# Patient Record
Sex: Male | Born: 1980 | Race: White | Hispanic: No | Marital: Married | Smoking: Current every day smoker
Health system: Southern US, Community
[De-identification: ages and names within clinical notes are randomized; demographics above are authoritative.]

## PROBLEM LIST (undated history)

## (undated) DIAGNOSIS — Z8709 Personal history of other diseases of the respiratory system: Secondary | ICD-10-CM

## (undated) DIAGNOSIS — Z87828 Personal history of other (healed) physical injury and trauma: Secondary | ICD-10-CM

## (undated) DIAGNOSIS — Z8659 Personal history of other mental and behavioral disorders: Secondary | ICD-10-CM

## (undated) HISTORY — DX: Personal history of other (healed) physical injury and trauma: Z87.828

## (undated) HISTORY — PX: OTHER SURGICAL HISTORY: SHX169

## (undated) HISTORY — DX: Personal history of other mental and behavioral disorders: Z86.59

## (undated) HISTORY — DX: Personal history of other diseases of the respiratory system: Z87.09

---

## 2004-10-17 HISTORY — PX: OTHER SURGICAL HISTORY: SHX169

## 2013-01-16 ENCOUNTER — Encounter: Payer: Self-pay | Admitting: Family Medicine

## 2013-01-16 ENCOUNTER — Ambulatory Visit (INDEPENDENT_AMBULATORY_CARE_PROVIDER_SITE_OTHER): Payer: 59 | Admitting: Family Medicine

## 2013-01-16 VITALS — BP 119/71 | HR 81 | Ht 70.0 in | Wt 202.0 lb

## 2013-01-16 DIAGNOSIS — Z716 Tobacco abuse counseling: Secondary | ICD-10-CM | POA: Insufficient documentation

## 2013-01-16 DIAGNOSIS — Z8709 Personal history of other diseases of the respiratory system: Secondary | ICD-10-CM | POA: Insufficient documentation

## 2013-01-16 DIAGNOSIS — Z7189 Other specified counseling: Secondary | ICD-10-CM

## 2013-01-16 DIAGNOSIS — Z87828 Personal history of other (healed) physical injury and trauma: Secondary | ICD-10-CM

## 2013-01-16 DIAGNOSIS — Z8659 Personal history of other mental and behavioral disorders: Secondary | ICD-10-CM

## 2013-01-16 DIAGNOSIS — Z13220 Encounter for screening for lipoid disorders: Secondary | ICD-10-CM | POA: Insufficient documentation

## 2013-01-16 DIAGNOSIS — Z87891 Personal history of nicotine dependence: Secondary | ICD-10-CM | POA: Insufficient documentation

## 2013-01-16 DIAGNOSIS — F17200 Nicotine dependence, unspecified, uncomplicated: Secondary | ICD-10-CM

## 2013-01-16 DIAGNOSIS — Z131 Encounter for screening for diabetes mellitus: Secondary | ICD-10-CM

## 2013-01-16 HISTORY — DX: Personal history of other mental and behavioral disorders: Z86.59

## 2013-01-16 HISTORY — DX: Personal history of other diseases of the respiratory system: Z87.09

## 2013-01-16 HISTORY — DX: Personal history of other (healed) physical injury and trauma: Z87.828

## 2013-01-16 LAB — LIPID PANEL
HDL: 30 mg/dL — ABNORMAL LOW (ref >39)
LDL Cholesterol: 80 mg/dL (ref 0–99)
Triglycerides: 337 mg/dL — ABNORMAL HIGH (ref <150)
VLDL: 67 mg/dL — ABNORMAL HIGH (ref 0–40)

## 2013-01-16 LAB — BASIC METABOLIC PANEL WITH GFR
Calcium: 9.1 mg/dL (ref 8.4–10.5)
Creat: 0.8 mg/dL (ref 0.50–1.35)
GFR, Est African American: >89 mL/min
GFR, Est Non African American: >89 mL/min
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
## 2013-01-17 ENCOUNTER — Encounter: Payer: Self-pay | Admitting: Family Medicine

## 2013-01-17 ENCOUNTER — Telehealth: Payer: Self-pay | Admitting: Family Medicine

## 2013-01-17 DIAGNOSIS — R7301 Impaired fasting glucose: Secondary | ICD-10-CM | POA: Insufficient documentation

## 2013-01-17 DIAGNOSIS — E781 Pure hyperglyceridemia: Secondary | ICD-10-CM

## 2013-01-17 MED ORDER — FENOFIBRATE 145 MG PO TABS: 145.0000 mg | ORAL_TABLET | Freq: Every day | ORAL | Status: DC

## 2013-01-17 NOTE — Telephone Encounter (Signed)
Sue Lush, Will you please let Erik Hampton know that his triglycerides were elevated at 337 which is double the upper limit of normal.  This can can contribute to chronic liver and pancreas inflammation and should be addressed given the degree of elevation.  I've sent an Rx to [facility] called fenofibrate which helps lower triglycerides.  I'd encourage him to start this and return for fasting labwork in three months.  Triglycerides can be further lowered with 30-45 minutes of exercise most days of the week and lowering saturated fat intake.  His blood sugar was just barely elevated, but not near the diabetic range, this can be controlled signifignatly with exercise as well.

## 2013-01-17 NOTE — Telephone Encounter (Signed)
LMOM that rx had been sent to pharmacy.

## 2013-01-17 NOTE — Telephone Encounter (Signed)
Good catch, thanks.  Rx has now been sent.

## 2013-01-17 NOTE — Telephone Encounter (Signed)
Called and notified pt. Pt voiced understanding; Dr. Ivan Anchors I didn't see where this rx has been sent yet

## 2013-02-17 ENCOUNTER — Encounter: Payer: Self-pay | Admitting: *Deleted

## 2013-02-17 ENCOUNTER — Emergency Department (INDEPENDENT_AMBULATORY_CARE_PROVIDER_SITE_OTHER)
Admission: EM | Admit: 2013-02-17 | Discharge: 2013-02-17 | Disposition: A | Discharge status: Home / Self Care | Payer: 59 | Source: Home / Self Care | Attending: Family Medicine | Admitting: Family Medicine

## 2013-02-17 DIAGNOSIS — T7840XA Allergy, unspecified, initial encounter: Secondary | ICD-10-CM

## 2013-02-17 MED ORDER — METHYLPREDNISOLONE SODIUM SUCC 125 MG IJ SOLR
125.0000 mg | Freq: Once | INTRAMUSCULAR | Status: AC
  Administered 2013-02-17: 125 mg via INTRAMUSCULAR

## 2013-02-17 MED ORDER — HYDROXYZINE HCL 25 MG PO TABS: 25.0000 mg | ORAL_TABLET | Freq: Three times a day (TID) | ORAL | Status: DC | PRN

## 2013-02-17 MED ORDER — PREDNISONE 50 MG PO TABS: ORAL_TABLET | ORAL | Status: DC

## 2013-02-17 NOTE — ED Provider Notes (Signed)
History     CSN: [id number]  Arrival date & time 02/17/13  1117   First MD Initiated Contact with Patient 02/17/13 1120      Chief Complaint  Patient presents with  . Urticaria  . Pruritis   HPI  HPI  This patient complains of a RASH  Location: upper extremities, neck   Onset: 4-5 days   Course: progressive itching, erythema over affected area   Self-treated with: nothing  Improvement with treatment: n/a   History  Itching: yes  Tenderness: no  New medications/antibiotics: yes, started on fenofibrate within last 3-4 weeks   Pet exposure: no  Recent travel or tropical exposure: no  New soaps, shampoos, detergent, clothing: no  Tick/insect exposure: no  Chemical Exposure: no  Red Flags  Feeling ill: no  Fever: no  Facial/tongue swelling/difficulty breathing: no  Diabetic or immunocompromised: no    Past Medical History  Diagnosis Date  . History of spleen injury 01/16/2013  . History of pneumothorax 01/16/2013  . History of anxiety 01/16/2013    Past Surgical History  Procedure Laterality Date  . Ruptured spleen  over 20 years ago  . Hemothorax  2006    Family History  Problem Relation Age of Onset  . Heart disease Paternal Grandfather     History  Substance Use Topics  . Smoking status: Former Smoker    Quit date: 12/16/2012  . Smokeless tobacco: Not on file  . Alcohol Use: Yes      Review of Systems  All other systems reviewed and are negative.    Allergies  Review of patient's allergies indicates no known allergies.  Home Medications   Current Outpatient Rx  Name  Route  Sig  Dispense  Refill  . fenofibrate (TRICOR) 145 MG tablet   Oral   Take 1 tablet (145 mg total) by mouth daily.   30 tablet   4     BP 122/73  Pulse 73  Temp(Src) 97.8 F (36.6 C) (Oral)  Resp 16  Ht 5\' 10"  (1.778 m)  Wt 207 lb 8 oz (94.121 kg)  BMI 29.77 kg/m2  SpO2 96%  Physical Exam  Constitutional: He appears well-developed and well-nourished.  HENT:    Head: Normocephalic and atraumatic.  Eyes: Conjunctivae are normal. Pupils are equal, round, and reactive to light.  Neck: Normal range of motion.  Cardiovascular: Normal rate and regular rhythm.   Pulmonary/Chest: Effort normal.  Abdominal: Soft.  Musculoskeletal: Normal range of motion.  Neurological: He is alert.  Skin: Rash noted.    ED Course  Procedures (including critical care time)  Labs Reviewed - No data to display No results found.   1. Allergic reaction, initial encounter       MDM  Suspect rash likely secondary to fenofibrate.  Will stop medication.  Place on course of prednisone and atarax.  Plan for follow up with PCP in 1-2 weeks for general reassessment.  Discussed general and derm red flags.  Follow up as needed.      The patient and/or caregiver has been counseled thoroughly with regard to treatment plan and/or medications prescribed including dosage, schedule, interactions, rationale for use, and possible side effects and they verbalize understanding. Diagnoses and expected course of recovery discussed and will return if not improved as expected or if the condition worsens. Patient and/or caregiver verbalized understanding.             Doree Albee, MD 02/17/13 [phone]

## 2013-02-17 NOTE — ED Notes (Addendum)
Pt c/o hives, itching x 3 days on arms. States the only thing different is Fenofibrate that was stated 1 month ago. Has tried OTC Hydrocortisone with no help.

## 2013-08-22 ENCOUNTER — Other Ambulatory Visit: Payer: Self-pay

## 2017-12-26 ENCOUNTER — Emergency Department
Admission: EM | Admit: 2017-12-26 | Discharge: 2017-12-26 | Disposition: A | Discharge status: Home / Self Care | Payer: BLUE CROSS/BLUE SHIELD | Source: Home / Self Care | Attending: Emergency Medicine | Admitting: Emergency Medicine

## 2017-12-26 ENCOUNTER — Other Ambulatory Visit: Payer: Self-pay

## 2017-12-26 ENCOUNTER — Encounter: Payer: Self-pay | Admitting: *Deleted

## 2017-12-26 DIAGNOSIS — B309 Viral conjunctivitis, unspecified: Secondary | ICD-10-CM

## 2017-12-26 MED ORDER — OFLOXACIN 0.3 % OP SOLN: 1.0000 [drp] | Freq: Four times a day (QID) | OPHTHALMIC | 0 refills | Status: DC

## 2017-12-26 NOTE — Discharge Instructions (Signed)
Use antibiotic drops as instructed. Follow-up with the eye doctor onThursday if persistent symptoms. Also use drops in your left eye if symptoms develop

## 2017-12-26 NOTE — ED Provider Notes (Signed)
[facility]    CSN: [id number] Arrival date & time: 12/26/17  1603     History   Chief Complaint Chief Complaint  Patient presents with  . Eye Problem    HPI Erik Hampton is a 37 y.o. male.  Patient presents with a one-day histo of pain and discomfort in his right. His eye feels irritated with significant watering. He denies any recent upper resp. infections . He denies any type of work where he could have gotten something in his  eye. He does not wear contacts. He has not had any pain.vision is not affected  Eye Problem    Past Medical History:  Diagnosis Date  . History of anxiety 01/16/2013  . History of pneumothorax 01/16/2013  . History of spleen injury 01/16/2013    Patient Active Problem List   Diagnosis Date Noted  . Hypertriglyceridemia 01/17/2013  . Impaired fasting blood sugar 01/17/2013  . Need for lipid screening 01/16/2013  . History of tobacco abuse 01/16/2013  . History of spleen injury 01/16/2013  . History of pneumothorax 01/16/2013  . Tobacco abuse counseling 01/16/2013  . History of anxiety 01/16/2013    Past Surgical History:  Procedure Laterality Date  . hemothorax  2006  . ruptured spleen  over 20 years ago       Home Medications    Prior to Admission medications   Not on File    Family History Family History  Problem Relation Age of Onset  . Heart disease Paternal Grandfather     Social History Social History   Tobacco Use  . Smoking status: Former Smoker    Packs/day: 0.75    Types: Cigarettes  . Smokeless tobacco: Never Used  Substance Use Topics  . Alcohol use: No    Frequency: Never  . Drug use: No     Allergies   Tricor [fenofibrate]   Review of Systems Review of Systems  Constitutional: Negative.   HENT: Negative for congestion, facial swelling, sinus pain and sore throat.   Eyes:       There is redness and irritation of the right. He has had significant watering. He does have some itching. He  denies pain or sensation of a foreign body in his eye     Physical Exam Triage Vital Signs ED Triage Vitals [12/26/17 1647]  Enc Vitals Group     BP 126/79     Pulse Rate 67     Resp 16     Temp 98.3 F (36.8 C)     Temp Source Oral     SpO2 96 %     Weight 196 lb (88.9 kg)     Height 5\' 10"  (1.778 m)     Head Circumference      Peak Flow      Pain Score 0     Pain Loc      Pain Edu?      Excl. in GC?    No data found.  Updated Vital Signs BP 126/79 (BP Location: Right Arm)   Pulse 67   Temp 98.3 F (36.8 C) (Oral)   Resp 16   Ht 5\' 10"  (1.778 m)   Wt 196 lb (88.9 kg)   SpO2 96%   BMI 28.12 kg/m   Visual Acuity Right Eye Distance: 20/20 Left Eye Distance: 20/20 Bilateral Distance: 20/20(w/o correction)  Right Eye Near:   Left Eye Near:    Bilateral Near:     Physical Exam  Constitutional: He  appears well-developed and well-nourished.  HENT:  Head: Normocephalic.  Eyes: EOM are normal. Pupils are equal, round, and reactive to light.  The conjunctiva is red. There is no purulence noted. Here is no swelling of the lids. Tetracaine was instilled. Floor seen staining revealed no uptake over the cornea. Lid was everted and swabbed with no foreign body found.     UC Treatments / Results  Labs (all labs ordered are listed, but only abnormal results are displayed) Labs Reviewed - No data to display  EKG  EKG Interpretation None       Radiology No results found.  Procedures Procedures (including critical care time)  Medications Ordered in UC Medications - No data to display   Initial Impression / Assessment and Plan / UC Course  I have reviewed the triage vital signs and the nursing notes.  Pertinent labs & imaging results that were available during my care of the patient were reviewed by me and considered in my medical decision making (see chart for details). I suspect this is a viral conjunctivitis.Will treat and cover with Ocuflox.Follow-up  wit an ophthalmologist if improvement over the next 48 hours.      Final Clinical Impressions(s) / UC Diagnoses   Final diagnoses:  Acute viral conjunctivitis of right eye    ED Discharge Orders    None     Use antibiotic drops as instructed. Follow-up with the eye doctor onThursday if persistent symptoms. Also use drops in your left eye if symptoms develop  Controlled Substance Prescriptions [place] Controlled Substance Registry consulted? Not Applicable   Collene Gobbleaub, Steven A, MD 12/26/17 2038

## 2017-12-26 NOTE — ED Triage Notes (Signed)
Pt c/o RT eye irritation, redness and watering x 1 day. Denies recent URI or injury.

## 2021-04-10 ENCOUNTER — Other Ambulatory Visit: Payer: Self-pay

## 2021-04-10 ENCOUNTER — Encounter: Payer: Self-pay | Admitting: Emergency Medicine

## 2021-04-10 ENCOUNTER — Emergency Department
Admission: EM | Admit: 2021-04-10 | Discharge: 2021-04-10 | Disposition: A | Discharge status: Home / Self Care | Payer: BLUE CROSS/BLUE SHIELD | Source: Home / Self Care

## 2021-04-10 DIAGNOSIS — H109 Unspecified conjunctivitis: Secondary | ICD-10-CM | POA: Diagnosis not present

## 2021-04-10 MED ORDER — MOXIFLOXACIN HCL 0.5 % OP SOLN: 1.0000 [drp] | Freq: Three times a day (TID) | OPHTHALMIC | 0 refills | Status: AC

## 2021-04-10 NOTE — Discharge Instructions (Addendum)
Advised patient to take medication as directed.

## 2021-04-10 NOTE — ED Triage Notes (Signed)
R eye pain x 1 week  Pt thought it was irritated from being outside  Pt was burning yard waste /limbs OTC eye gtts- can not recall name - -no relief Wakes up in the am w/ only right eye crusted shut  No COVID vaccine

## 2021-04-10 NOTE — ED Provider Notes (Signed)
[facility]    CSN: [id number] Arrival date & time: 04/10/21  0909      History   Chief Complaint Chief Complaint  Patient presents with   Conjunctivitis    Right    HPI Erik Hampton is a 40 y.o. male.   HPI 40 year old male presents with a right eye pain for 1 week.  Patient reports burning yard waste and limbs in his yard this past week and has been using OTC eyedrops (cannot recall name with no relief).  Reports awakening this morning with right eye crusted shut.  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 Authorizing Provider  moxifloxacin (VIGAMOX) 0.5 % ophthalmic solution Place 1 drop into the right eye 3 (three) times daily for 5 days. 04/10/21 04/15/21 Yes Trevor Iha, FNP    Family History Family History  Problem Relation Age of Onset   Healthy Mother    Healthy Father    Heart disease Paternal Grandfather     Social History Social History   Tobacco Use   Smoking status: Every Day    Packs/day: 0.75    Pack years: 0.00    Types: Cigarettes   Smokeless tobacco: Never  Vaping Use   Vaping Use: Never used  Substance Use Topics   Alcohol use: No   Drug use: No     Allergies   Tricor [fenofibrate]   Review of Systems Review of Systems  Constitutional: Negative.    HENT: Negative.    Eyes:  Positive for pain, discharge and redness.  Respiratory: Negative.    Cardiovascular: Negative.   Gastrointestinal: Negative.   Genitourinary: Negative.   Musculoskeletal: Negative.   Skin: Negative.   Neurological: Negative.     Physical Exam Triage Vital Signs ED Triage Vitals  Enc Vitals Group     BP 04/10/21 0935 123/78     Pulse Rate 04/10/21 0935 71     Resp 04/10/21 0935 15     Temp 04/10/21 0935 98.5 F (36.9 C)     Temp Source 04/10/21 0935 Oral     SpO2 04/10/21 0935 97 %     Weight --      Height --      Head Circumference --      Peak Flow --      Pain Score 04/10/21 0938 0     Pain Loc --      Pain Edu? --      Excl. in GC? --    No data found.  Updated Vital Signs BP 123/78 (BP Location: Right Arm)   Pulse 71   Temp 98.5 F (36.9 C) (Oral)   Resp 15   SpO2 97%   Visual Acuity Right Eye Distance: 20/25 Left Eye Distance: 20/25  Bilateral Distance: 20/20     Physical Exam Vitals and nursing note reviewed.  Constitutional:      General: He is not in acute distress.    Appearance: Normal appearance. He is normal weight. He is not ill-appearing.  HENT:     Head: Normocephalic and atraumatic.     Right Ear: Tympanic membrane and ear canal normal.     Left Ear: Tympanic membrane and ear canal normal.     Mouth/Throat:     Mouth: Mucous membranes are moist.     Pharynx: Oropharynx is clear.  Eyes:     Extraocular Movements: Extraocular movements intact.     Pupils: Pupils are equal, round, and reactive to light.     Comments: Right eye: Conjunctiva +3 injection, trace crusting of lower eyelid at right inner canthus  Cardiovascular:     Rate and Rhythm: Normal rate and regular rhythm.     Pulses: Normal pulses.     Heart sounds: Normal heart sounds.  Pulmonary:     Effort: Pulmonary effort is normal. No respiratory distress.     Breath sounds: Normal breath sounds. No wheezing, rhonchi or rales.  Musculoskeletal:         General: Normal range of motion.     Cervical back: Normal range of motion and neck supple. No tenderness.  Lymphadenopathy:     Cervical: No cervical adenopathy.  Skin:    General: Skin is warm and dry.  Neurological:     General: No focal deficit present.     Mental Status: He is alert and oriented to person, place, and time.  Psychiatric:        Mood and Affect: Mood normal.        Behavior: Behavior normal.     UC Treatments / Results  Labs (all labs ordered are listed, but only abnormal results are displayed) Labs Reviewed - No data to display  EKG   Radiology No results found.  Procedures Procedures (including critical care time)  Medications Ordered in UC Medications - No data to display  Initial Impression / Assessment and Plan / UC Course  I have reviewed the triage vital signs and the nursing notes.  Pertinent labs & imaging results that were available during my care of the patient were reviewed by me and considered in my medical decision making (see chart for details).    MDM: 1.  Conjunctivitis of right eye, unspecified conjunctivitis type.  Patient discharged home, hemodynamically stable. Final Clinical Impressions(s) / UC Diagnoses   Final diagnoses:  Conjunctivitis of right eye, unspecified conjunctivitis type     Discharge Instructions      Advised patient to take medication as directed.     ED Prescriptions     Medication Sig Dispense Auth. Provider   moxifloxacin (VIGAMOX) 0.5 % ophthalmic solution Place 1 drop into the right eye 3 (three) times daily for 5 days. 3 mL Trevor Iha, FNP      PDMP not reviewed this encounter.   Trevor Iha, FNP 04/10/21 1029

## 2022-03-09 ENCOUNTER — Emergency Department
Admission: EM | Admit: 2022-03-09 | Discharge: 2022-03-09 | Disposition: A | Discharge status: Home / Self Care | Payer: BC Managed Care – PPO | Source: Home / Self Care

## 2022-03-09 ENCOUNTER — Emergency Department (INDEPENDENT_AMBULATORY_CARE_PROVIDER_SITE_OTHER): Payer: BC Managed Care – PPO

## 2022-03-09 DIAGNOSIS — S62603B Fracture of unspecified phalanx of left middle finger, initial encounter for open fracture: Secondary | ICD-10-CM

## 2022-03-09 DIAGNOSIS — S6992XA Unspecified injury of left wrist, hand and finger(s), initial encounter: Secondary | ICD-10-CM

## 2022-03-09 DIAGNOSIS — S62607A Fracture of unspecified phalanx of left little finger, initial encounter for closed fracture: Secondary | ICD-10-CM

## 2022-03-09 NOTE — ED Provider Notes (Signed)
[facility]    CSN: [id number] Arrival date & time: 03/09/22  1422      History   Chief Complaint Chief Complaint  Patient presents with   Finger Injury    LT pinky    HPI Erik Hampton is a 41 y.o. male.   HPI 41 year old male presents with left pinky injury 3 weeks ago.  Patient reports was playing basketball with his son when he jammed his left fifth finger.  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   History of pneumothorax 01/16/2013   Tobacco abuse counseling 01/16/2013   History of anxiety 01/16/2013    Past Surgical History:  Procedure Laterality Date   hemothorax  2006   ruptured spleen  over 20 years ago       Home Medications    Prior to Admission medications   Not on File    Family History Family History  Problem Relation Age of Onset   Healthy Mother    Healthy Father    Heart disease Paternal Grandfather     Social History Social History   Tobacco Use   Smoking status: Every Day    Packs/day: 0.75    Types: Cigarettes   Smokeless tobacco: Never  Vaping Use   Vaping Use: Never used  Substance Use Topics   Alcohol use: No   Drug use: No     Allergies   Tricor [fenofibrate]   Review of Systems Review of Systems  Musculoskeletal:        Left fifth finger pain x 3-weeks    Physical Exam Triage Vital Signs ED Triage Vitals [03/09/22 1435]  Enc Vitals Group     BP 138/78     Pulse Rate 81     Resp 18     Temp 98 F (36.7 C)     Temp Source Oral     SpO2 96 %     Weight      Height      Head Circumference      Peak Flow      Pain Score 2     Pain Loc      Pain  Edu?      Excl. in [place]?    No data found.  Updated Vital Signs BP 138/78 (BP Location: Right Arm)   Pulse 81   Temp 98 F (36.7 C) (Oral)   Resp 18   SpO2 96%      Physical Exam Vitals and nursing note reviewed.  Constitutional:      Appearance: Normal appearance. He is normal weight.  HENT:     Head: Normocephalic and atraumatic.     Mouth/Throat:     Mouth: Mucous membranes are moist.     Pharynx: Oropharynx is clear.  Eyes:     Extraocular Movements: Extraocular movements intact.     Conjunctiva/sclera: Conjunctivae normal.     Pupils: Pupils are equal, round, and reactive to light.  Cardiovascular:     Rate and Rhythm: Normal rate and regular rhythm.     Pulses: Normal pulses.     Heart sounds: Normal heart sounds.  Pulmonary:  Effort: Pulmonary effort is normal.     Breath sounds: Normal breath sounds. No wheezing, rhonchi or rales.  Musculoskeletal:     Cervical back: Normal range of motion and neck supple.     Comments: Left hand fifth finger (dorsum): TTP over proximal/medial phalanx with mild soft tissue swelling noted at IP junction, LROM  Skin:    General: Skin is warm and dry.  Neurological:     General: No focal deficit present.     Mental Status: He is alert and oriented to person, place, and time.     UC Treatments / Results  Labs (all labs ordered are listed, but only abnormal results are displayed) Labs Reviewed - No data to display  EKG   Radiology DG Finger Little Left  Result Date: 03/09/2022 CLINICAL DATA:  Finger injury EXAM: LEFT LITTLE FINGER 2+V COMPARISON:  None Available. FINDINGS: There appears to be a subtle acute nondisplaced fracture at the volar base of the fifth middle phalanx. No dislocation. Soft tissue swelling about the proximal interphalangeal joint. IMPRESSION: Suspected fracture at the volar base of the fifth middle phalanx as described, correlate with point tenderness. Electronically Signed   By: Ofilia Neas  M.D.   On: 03/09/2022 14:49    Procedures Procedures (including critical care time)  Medications Ordered in UC Medications - No data to display  Initial Impression / Assessment and Plan / UC Course  I have reviewed the triage vital signs and the nursing notes.  Pertinent labs & imaging results that were available during my care of the patient were reviewed by me and considered in my medical decision making (see chart for details).     MDM: 1.  Finger injury, left, initial encounter-Patient reports jammed while playing basketball with his son 3 weeks ago, left hand x-ray revealed above; 2. Close non-displaced fracture of middle phalanx of left fifth finger-Advised/informed patient of left fifth finger x-ray results with hard copy provided to patient.  Instructed patient to wear left fifth finger splint 24/7 until being evaluated by orthopedic provider for further evaluation and fracture management.  [facility] orthopedic provider contact information included with this AVS. patient discharged home, hemodynamically stable. Final Clinical Impressions(s) / UC Diagnoses   Final diagnoses:  Finger injury, left, initial encounter  Closed displaced fracture of phalanx of left little finger, unspecified phalanx, initial encounter     Discharge Instructions      Advised/informed patient of left fifth finger x-ray results with hard copy provided to patient.  Instructed patient to wear left fifth finger splint 24/7 until being evaluated by orthopedic provider for further evaluation and fracture management.  [facility] orthopedic provider contact information included with this AVS.     ED Prescriptions   None    PDMP not reviewed this encounter.   Eliezer Lofts, [place] 03/09/22 1537

## 2022-03-09 NOTE — ED Triage Notes (Signed)
Pt c/o LT pinky injury x 3 weeks. Was playing basketball with son when he jammed his finger. Pain and swelling at time, swelling resolved but still having Ltd ROM. Pain 2/10

## 2022-03-09 NOTE — Discharge Instructions (Addendum)
Advised/informed patient of left fifth finger x-ray results with hard copy provided to patient.  Instructed patient to wear left fifth finger splint 24/7 until being evaluated by orthopedic provider for further evaluation and fracture management.  [facility] orthopedic provider contact information included with this AVS.

## 2022-10-06 ENCOUNTER — Ambulatory Visit
Admission: EM | Admit: 2022-10-06 | Discharge: 2022-10-06 | Disposition: A | Discharge status: Home / Self Care | Payer: BC Managed Care – PPO | Attending: Family Medicine | Admitting: Family Medicine

## 2022-10-06 DIAGNOSIS — H6692 Otitis media, unspecified, left ear: Secondary | ICD-10-CM

## 2022-10-06 MED ORDER — AMOXICILLIN-POT CLAVULANATE 875-125 MG PO TABS: ORAL_TABLET | ORAL | 0 refills | Status: DC

## 2022-10-06 NOTE — ED Triage Notes (Signed)
Pt presents with c/o rt ear pain x 3 days

## 2022-10-06 NOTE — Discharge Instructions (Addendum)
May take Pseudoephedrine (30mg, one or two every 4 to 6 hours) for sinus congestion.    May use Afrin nasal spray (or generic oxymetazoline) each morning for about 5 days and then discontinue.  Also recommend using saline nasal spray several times daily and saline nasal irrigation (AYR is a common brand).  Use Flonase nasal spray each morning after using Afrin nasal spray and saline nasal irrigation.   

## 2022-10-06 NOTE — ED Provider Notes (Signed)
[facility]    CSN: [id number] Arrival date & time: 10/06/22  1247      History   Chief Complaint Chief Complaint  Patient presents with   Otalgia    HPI Erik Hampton is a 41 y.o. male.   Five days ago patient developed myalgias, fever (now resolved), and sinus congestion.  Yesterday he developed left ear pain with purulent drainage.  He denies cough and shortness of breath.  The history is provided by the patient.    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 Authorizing Provider  amoxicillin-clavulanate (AUGMENTIN) 875-125 MG tablet Take one tab PO Q12hr PC 10/06/22  Yes Eldine Rencher, Tera Mater, MD    Family History Family History  Problem Relation Age of Onset   Healthy Mother    Healthy Father    Heart disease Paternal Grandfather     Social History Social History   Tobacco Use   Smoking status: Every Day    Packs/day: 0.75    Types: Cigarettes   Smokeless tobacco: Never  Vaping Use   Vaping Use: Never used  Substance Use Topics   Alcohol use: No   Drug use: No     Allergies   Tricor [fenofibrate]   Review of Systems Review of Systems No sore throat No cough No pleuritic pain No wheezing + nasal congestion + post-nasal drainage + sinus pain/pressure No itchy/red  eyes + left earache No hemoptysis No SOB No fever/chills No nausea No vomiting No abdominal pain No diarrhea No urinary symptoms No skin rash No fatigue + myalgias No headache   Physical Exam Triage Vital Signs ED Triage Vitals  Enc Vitals Group     BP 10/06/22 1342 139/84     Pulse Rate 10/06/22 1342 80     Resp 10/06/22 1342 12     Temp 10/06/22 1342 98.8 F (37.1 C)     Temp Source 10/06/22 1342 Oral     SpO2 10/06/22 1342 98 %     Weight --      Height --      Head Circumference --      Peak Flow --      Pain Score 10/06/22 1343 0     Pain Loc --      Pain Edu? --      Excl. in [place]? --    No data found.  Updated Vital Signs BP 139/84 (BP Location: Right Arm)   Pulse 80   Temp 98.8 F (37.1 C) (Oral)   Resp 12   SpO2 98%   Visual Acuity Right Eye Distance:   Left Eye Distance:   Bilateral Distance:    Right Eye Near:   Left Eye Near:    Bilateral Near:  Physical Exam Nursing notes and Vital Signs reviewed. Appearance:  Patient appears stated age, and in no acute distress Eyes:  Pupils are equal, round, and reactive to light and accomodation.  Extraocular movement is intact.  Conjunctivae are not inflamed  Ears:  Canals normal. Right tympanic membrane normal.  Left tympanic membrane erythematous with decreased landmarks, and evidence of recent purulent drainage. Nose:  Congested turbinates.  Maxillary sinus tenderness is present.  Pharynx:  Normal Neck:  Supple. No adenopathy. Lungs:  Clear to auscultation.  Breath sounds are equal.  Moving air well. Heart:  Regular rate and rhythm without murmurs, rubs, or gallops.  Abdomen:  Nontender without masses or hepatosplenomegaly.  Bowel sounds are present.  No CVA or flank tenderness.  Extremities:  No edema.  Skin:  No rash present.   UC Treatments / Results  Labs (all labs ordered are listed, but only abnormal results are displayed) Labs Reviewed - No data to display  EKG   Radiology No  results found.  Procedures Procedures (including critical care time)  Medications Ordered in UC Medications - No data to display  Initial Impression / Assessment and Plan / UC Course  I have reviewed the triage vital signs and the nursing notes.  Pertinent labs & imaging results that were available during my care of the patient were reviewed by me and considered in my medical decision making (see chart for details).    Begin Augmentin. Followup with Family Doctor if not improved in one week.  Final Clinical Impressions(s) / UC Diagnoses   Final diagnoses:  Acute left otitis media     Discharge Instructions      May take Pseudoephedrine (30mg , one or two every 4 to 6 hours) for sinus congestion.    May use Afrin nasal spray (or generic oxymetazoline) each morning for about 5 days and then discontinue.  Also recommend using saline nasal spray several times daily and saline nasal irrigation (AYR is a common brand).  Use Flonase nasal spray each morning after using Afrin nasal spray and saline nasal irrigation.      ED Prescriptions     Medication Sig Dispense Auth. Provider   amoxicillin-clavulanate (AUGMENTIN) 875-125 MG tablet Take one tab PO Q12hr PC 14 tablet , MD         Lattie Haw, MD 10/08/22 [phone]

## 2022-10-13 ENCOUNTER — Ambulatory Visit
Admission: RE | Admit: 2022-10-13 | Discharge: 2022-10-13 | Disposition: A | Discharge status: Home / Self Care | Payer: BC Managed Care – PPO | Source: Ambulatory Visit | Attending: Family Medicine | Admitting: Family Medicine

## 2022-10-13 VITALS — BP 127/90 | HR 76 | Temp 98.2°F | Resp 14

## 2022-10-13 DIAGNOSIS — H66003 Acute suppurative otitis media without spontaneous rupture of ear drum, bilateral: Secondary | ICD-10-CM

## 2022-10-13 MED ORDER — CEFDINIR 300 MG PO CAPS: 300.0000 mg | ORAL_CAPSULE | Freq: Two times a day (BID) | ORAL | 0 refills | Status: DC

## 2022-10-13 NOTE — ED Triage Notes (Signed)
Pt presents with continued ear pain, pt states he has one dose of abx left

## 2022-10-13 NOTE — Discharge Instructions (Signed)
Drink lots of water Take the cefdinir 2 times a day for 10 full days Use the Flonase 2 times a day for 1 to 2 days, then once a day until your symptoms have resolved May take over-the-counter medicines as needed for pain

## 2023-11-25 IMAGING — DX DG FINGER LITTLE 2+V*L*
3 series · 3 of 3 positions shown · non-contrast
Comparison: None Available.

CLINICAL DATA: Finger injury

EXAM:
LEFT LITTLE FINGER 2+V

[finger ap]
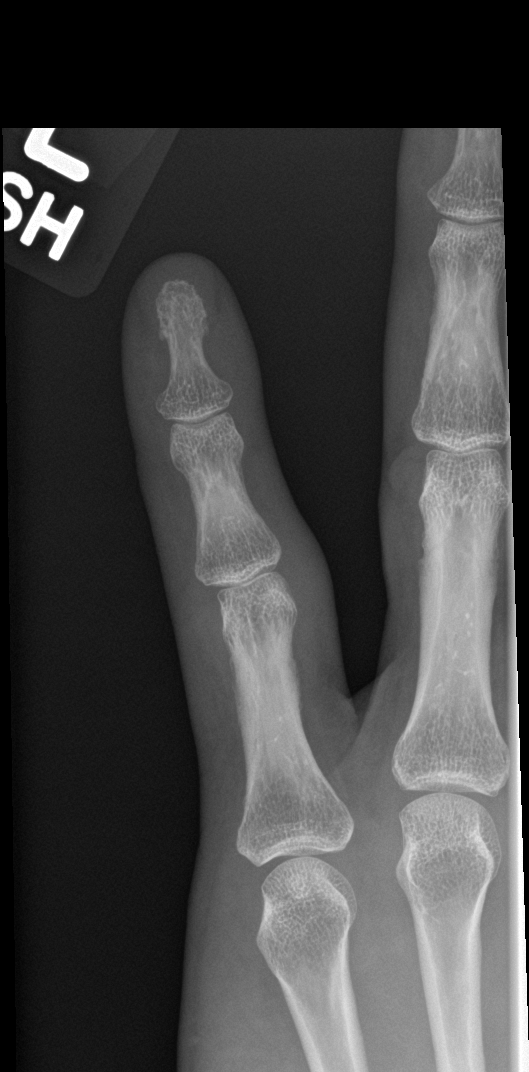

[finger obl]
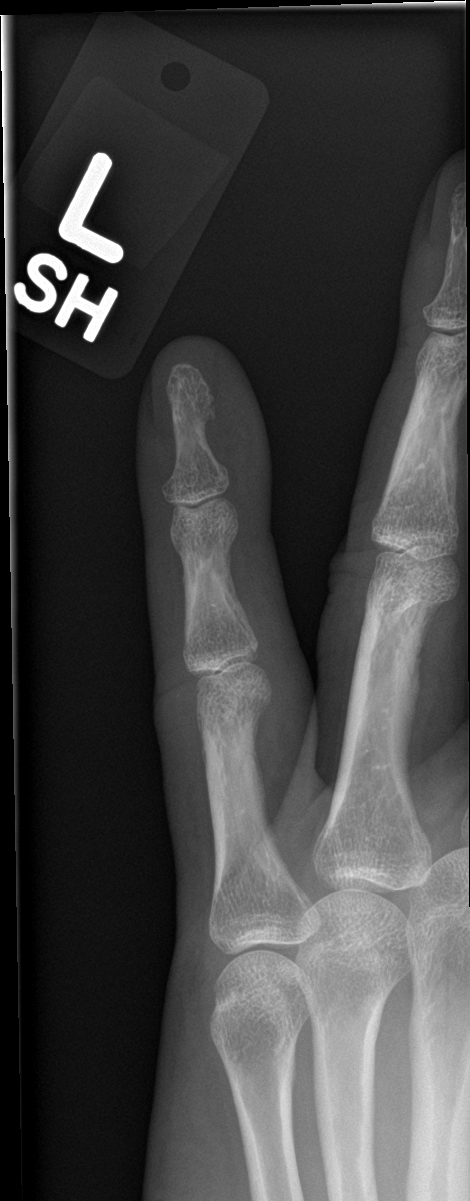

[finger lat]
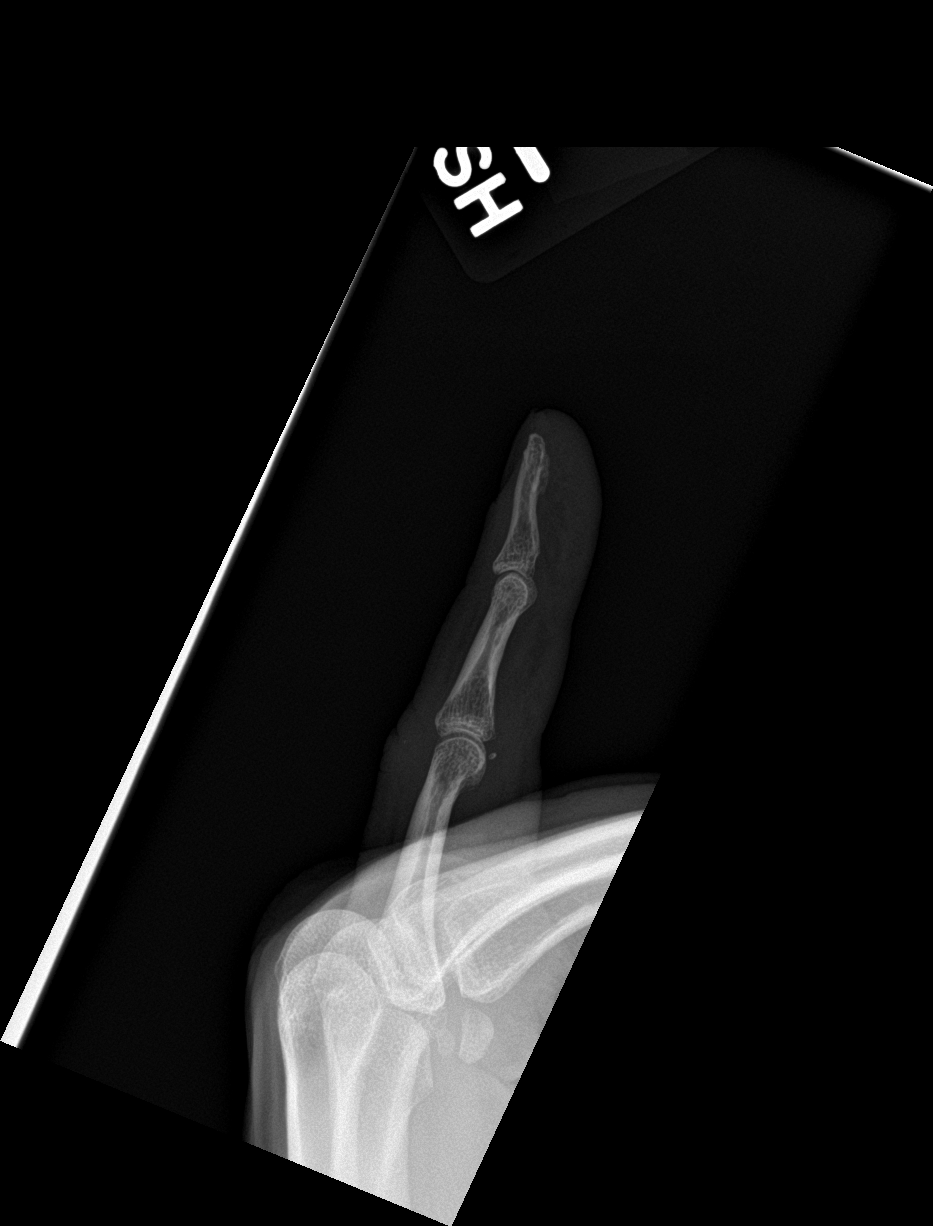

[3 of 3 positions shown; findings below may reference images not displayed]

FINDINGS: There appears to be a subtle acute nondisplaced fracture at the
volar base of the fifth middle phalanx. No dislocation. Soft tissue
swelling about the proximal interphalangeal joint.
IMPRESSION: Suspected fracture at the volar base of the fifth middle phalanx as
described, correlate with point tenderness.

## 2024-03-11 ENCOUNTER — Ambulatory Visit
Admission: EM | Admit: 2024-03-11 | Discharge: 2024-03-11 | Disposition: A | Discharge status: Home / Self Care | Attending: Family Medicine | Admitting: Family Medicine

## 2024-03-11 DIAGNOSIS — R21 Rash and other nonspecific skin eruption: Secondary | ICD-10-CM | POA: Diagnosis not present

## 2024-03-11 DIAGNOSIS — L03114 Cellulitis of left upper limb: Secondary | ICD-10-CM | POA: Diagnosis not present

## 2024-03-11 MED ORDER — PREDNISONE 10 MG (21) PO TBPK: ORAL_TABLET | Freq: Every day | ORAL | 0 refills | Status: AC

## 2024-03-11 MED ORDER — DOXYCYCLINE HYCLATE 100 MG PO CAPS: 100.0000 mg | ORAL_CAPSULE | Freq: Two times a day (BID) | ORAL | 0 refills | Status: AC

## 2024-03-11 NOTE — ED Provider Notes (Signed)
 [facility]    CSN: [id number] Arrival date & time: 03/11/24  0908      History   Chief Complaint Chief Complaint  Patient presents with   Rash    HPI Jilberto Vanderwall is a 43 y.o. male.   HPI Very pleasant 43 year old male presents with a rash/infection of left arm for 7 days.  PMH significant for hypertriglyceridemia and tobacco abuse.  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 Authorizing Provider  doxycycline (VIBRAMYCIN) 100 MG capsule Take 1 capsule (100 mg total) by mouth 2 (two) times daily for 10 days. 03/11/24 03/21/24 Yes Leonides Ramp, FNP  predniSONE  (STERAPRED UNI-PAK 21 TAB) 10 MG (21) TBPK tablet Take by mouth daily. Take 6 tabs by mouth daily  for 2 days, then 5 tabs for 2 days, then 4 tabs for 2 days, then 3 tabs for 2 days, 2 tabs for 2 days, then 1 tab by mouth daily for 2 days 03/11/24  Yes Leonides Ramp, FNP    Family History Family History  Problem Relation Age of Onset   Healthy Mother    Healthy Father    Heart disease Paternal Grandfather     Social History Social History   Tobacco Use   Smoking status: Every Day    Current packs/day: 0.75    Average packs/day: 0.8 packs/day for 30.0 years (22.5 ttl pk-yrs)    Types: Cigarettes    Start date: 03/11/1994   Smokeless  tobacco: Never  Vaping Use   Vaping status: Never Used  Substance Use Topics   Alcohol use: No   Drug use: No     Allergies   Tricor  [fenofibrate ]   Review of Systems Review of Systems  Skin:  Positive for rash.  All other systems reviewed and are negative.    Physical Exam Triage Vital Signs ED Triage Vitals  Encounter Vitals Group     BP      Systolic BP Percentile      Diastolic BP Percentile      Pulse      Resp      Temp      Temp src      SpO2      Weight      Height      Head Circumference      Peak Flow      Pain Score      Pain Loc      Pain Education      Exclude from Growth Chart    No data found.  Updated Vital Signs BP 130/89   Pulse 96   Temp 99.2 F (37.3 C) (Oral)   Resp 16   SpO2 98%    Physical Exam Vitals and nursing note reviewed.  Constitutional:      Appearance: Normal appearance. He is normal weight.  HENT:     Head: Normocephalic and atraumatic.     Mouth/Throat:     Mouth: Mucous membranes are moist.     Pharynx: Oropharynx is clear.  Eyes:     Extraocular Movements: Extraocular movements intact.     Conjunctiva/sclera: Conjunctivae normal.     Pupils: Pupils are equal, round, and reactive to light.  Cardiovascular:     Rate and Rhythm: Normal rate and regular rhythm.     Pulses: Normal pulses.     Heart sounds: Normal heart sounds.  Pulmonary:     Effort: Pulmonary effort is normal.     Breath sounds: Normal breath sounds. No wheezing, rhonchi or rales.  Musculoskeletal:        General: Normal range of motion.  Skin:    General: Skin is warm and dry.     Comments: Left upper arm (lateral aspect): Grouped erythematous, vesicular lesions noted over indurated, mildly fluctuant plaques please see image below  Neurological:     General: No focal deficit present.     Mental Status: He is alert and oriented to person, place, and time. Mental status is at baseline.  Psychiatric:        Mood and Affect: Mood normal.         Behavior: Behavior normal.      UC Treatments / Results  Labs (all labs ordered are listed, but only abnormal results are displayed) Labs Reviewed - No data to display  EKG   Radiology No results found.  Procedures Procedures (including critical care time)  Medications Ordered in UC Medications - No data to display  Initial Impression / Assessment and Plan / UC Course  I have reviewed the triage vital signs and the nursing notes.  Pertinent labs & imaging results that were available during my care of the patient were reviewed by me and considered in my medical decision making (see chart for details).     MDM: 1.  Cellulitis of left upper arm-Rx'd doxycycline 100 mg capsule: Take 1 capsule twice daily x 10 days; 2.  Rash and nonspecific skin eruption-Rx'd Sterapred Unipak (21 tab 10 mg taper). Advised patient to take medication as directed with food to completion.  Encouraged to increase daily water intake to 64 ounces per day while taking these medications.  Advised if symptoms worsen and/or unresolved please follow-up with your PCP or here for further evaluation.  Patient discharged home, hemodynamically stable. Final Clinical Impressions(s) / UC Diagnoses   Final diagnoses:  Rash and nonspecific skin eruption  Cellulitis of left upper arm     Discharge Instructions      Advised patient to take medication as directed with food to completion.  Encouraged to increase daily water intake to 64 ounces per day while taking these medications.  Advised if symptoms worsen and/or unresolved please follow-up with your PCP or here for further evaluation.   ED Prescriptions     Medication Sig Dispense Auth. Provider   doxycycline (VIBRAMYCIN) 100 MG capsule Take 1 capsule (100 mg total) by mouth 2 (two) times daily for 10 days. 20 capsule Rashaad Hallstrom, FNP   predniSONE  (STERAPRED UNI-PAK 21 TAB) 10 MG (21) TBPK tablet Take by mouth daily. Take 6 tabs by mouth daily  for 2  days, then 5 tabs for 2 days, then 4 tabs for 2 days, then 3 tabs for 2 days, 2 tabs for 2 days, then  1 tab by mouth daily for 2 days 42 tablet Malayia Spizzirri, FNP      PDMP not reviewed this encounter.   Leonides Ramp, FNP 03/11/24 [phone]

## 2024-03-11 NOTE — Discharge Instructions (Addendum)
 Advised patient to take medication as directed with food to completion.  Encouraged to increase daily water intake to 64 ounces per day while taking these medications.  Advised if symptoms worsen and/or unresolved please follow-up with your PCP or here for further evaluation.

## 2024-03-11 NOTE — ED Triage Notes (Signed)
 Pt presents to uc with co rash to left arm bicept aera. Pt reports early may he had the same rash on his right arm and it was itchy in nature, he kept it covered and clean and it got better but now is on his left arm.
